# Patient Record
Sex: Female | Born: 1965 | Race: White | Hispanic: No | Marital: Single | Smoking: Never smoker
Health system: Southern US, Community
[De-identification: ages and names within clinical notes are randomized; demographics above are authoritative.]

## PROBLEM LIST (undated history)

## (undated) DIAGNOSIS — E119 Type 2 diabetes mellitus without complications: Secondary | ICD-10-CM

## (undated) HISTORY — PX: KNEE ARTHROSCOPY: SUR90

---

## 2012-01-24 ENCOUNTER — Emergency Department (HOSPITAL_BASED_OUTPATIENT_CLINIC_OR_DEPARTMENT_OTHER)
Admission: EM | Admit: 2012-01-24 | Discharge: 2012-01-24 | Disposition: A | Discharge status: Home / Self Care | Payer: BC Managed Care – PPO | Attending: Emergency Medicine | Admitting: Emergency Medicine

## 2012-01-24 ENCOUNTER — Emergency Department (HOSPITAL_BASED_OUTPATIENT_CLINIC_OR_DEPARTMENT_OTHER): Payer: BC Managed Care – PPO

## 2012-01-24 ENCOUNTER — Encounter (HOSPITAL_BASED_OUTPATIENT_CLINIC_OR_DEPARTMENT_OTHER): Payer: Self-pay | Admitting: *Deleted

## 2012-01-24 DIAGNOSIS — S6990XA Unspecified injury of unspecified wrist, hand and finger(s), initial encounter: Secondary | ICD-10-CM | POA: Insufficient documentation

## 2012-01-24 DIAGNOSIS — Y998 Other external cause status: Secondary | ICD-10-CM | POA: Insufficient documentation

## 2012-01-24 DIAGNOSIS — Y9301 Activity, walking, marching and hiking: Secondary | ICD-10-CM | POA: Insufficient documentation

## 2012-01-24 DIAGNOSIS — S6992XA Unspecified injury of left wrist, hand and finger(s), initial encounter: Secondary | ICD-10-CM

## 2012-01-24 DIAGNOSIS — W010XXA Fall on same level from slipping, tripping and stumbling without subsequent striking against object, initial encounter: Secondary | ICD-10-CM | POA: Insufficient documentation

## 2012-01-24 DIAGNOSIS — S59909A Unspecified injury of unspecified elbow, initial encounter: Secondary | ICD-10-CM | POA: Insufficient documentation

## 2012-01-24 NOTE — ED Notes (Addendum)
Given ice pack

## 2012-01-24 NOTE — ED Notes (Signed)
Pt states she slipped on the grass and tried to catch herself with her left wrist. No c/o pain to the left wrist and forearm. +radial pulse. Moves fingers. Feels touch. Cap refill < 3 sec.

## 2012-01-24 NOTE — Discharge Instructions (Signed)
Continue taking over-the-counter ibuprofen as needed for your symptoms.

## 2012-01-24 NOTE — ED Provider Notes (Signed)
History  This chart was scribed for Cyndra Numbers, MD by Erskine Emery. This patient was seen in room MH03/MH03 and the patient's care was started at 1727.  CSN: [id number]  Arrival date & time 01/24/12  1613   First MD Initiated Contact with Patient 01/24/12 1727      Chief Complaint  Patient presents with  . Fall    (Consider location/radiation/quality/duration/timing/severity/associated sxs/prior treatment) HPI  Erica Olson is a 46 y.o. female who presents to the Emergency Department complaining of constant pain in the wrist and forearm with a 5/5 severity since this afternoon with associated hematoma. Pt reports she was walking on the grass, slipped, and fell on her left wrist but denies hitting head. Pt denies any associated loss of consciousness or other injuries. Pt reports numbness in forearm, possibly from the ice, but denies any numbness in her hand. Pt reports she took Ibuprofen PTA which improved her symptoms. Pt reports she is right handed and she drove herself to the ED today. Pt reports no prior hx of similar symptoms. Pain is worse with movement or palpation.   History reviewed. No pertinent past medical history.  History reviewed. No pertinent past surgical history.  History reviewed. No pertinent family history.  History  Substance Use Topics  . Smoking status: Never Smoker   . Smokeless tobacco: Not on file  . Alcohol Use: No    OB History    Grav Para Term Preterm Abortions TAB SAB Ect Mult Living                  Review of Systems  Constitutional: Negative.   HENT: Negative.   Eyes: Negative.   Respiratory: Negative.   Cardiovascular: Negative.   Gastrointestinal: Negative.   Genitourinary: Negative.   Musculoskeletal:       Left wrist pain  Skin: Negative.   Neurological: Negative.  Negative for syncope.  Hematological: Negative.   Psychiatric/Behavioral: Negative.   All other systems reviewed and are negative.   A complete 10 system review of  systems was obtained and all systems are negative except as noted in the HPI and PMH.   Allergies  Review of patient's allergies indicates not on file.  Home Medications  No current outpatient prescriptions on file.  Triage Vitals: BP 157/83  Pulse 92  Temp 98 F (36.7 C) (Oral)  Resp 20  Ht 5\' 1"  (1.549 m)  Wt 270 lb (122.471 kg)  BMI 51.02 kg/m2  SpO2 98%  LMP 01/10/2012  Physical Exam GEN: Well-developed, well-nourished female in no distress HEENT: Atraumatic, normocephalic.  EYES: PERRLA BL, no scleral icterus. NECK: Trachea midline, no meningismus CV: regular rate and rhythm.  PULM: No respiratory distress.   Neuro: cranial nerves grossly 2-12 intact, no abnormalities of strength or sensation, A and O x 3 MSK: Left wrist no obvious deformity. Radial pulses 2+ and strong. Patient with slight area of numbness over the left forearm which is 2 inches in length and 1 inch in diameter and slightly ecchymotic. Patient's been holding ice to wrist and feels that this is the cause of the numbness. Grip is 55 the left. Otherwise patient has exam within normal limits  Skin: No rashes petechiae, purpura, or jaundice. Slight ecchymosis developing over the left forearm. Psych: no abnormality of mood  ED Course  Procedures (including critical care time)  DIAGNOSTIC STUDIES: Oxygen Saturation is 98% on room air, normal by my interpretation.    COORDINATION OF CARE: 17:56--I discussed treatment plan including a  splint with pt and pt agreed. I informed her that her x-ray shows no fractures.    Labs Reviewed - No data to display Dg Forearm Left  01/24/2012  *RADIOLOGY REPORT*  Clinical Data: Fall  LEFT FOREARM - 2 VIEW  Comparison: None.  Findings: No acute fracture and no dislocation.  Unremarkable soft tissues.  IMPRESSION: No acute bony pathology.  Original Report Authenticated By: Donavan Burnet, M.D.   Dg Wrist Complete Left  01/24/2012  *RADIOLOGY REPORT*  Clinical Data: Fall  LEFT  WRIST - COMPLETE 3+ VIEW  Comparison: None.  Findings: No acute fracture and no dislocation.  Unremarkable soft tissues. Mild positive ulnar variance.  IMPRESSION: No acute bony pathology.  Original Report Authenticated By: Donavan Burnet, M.D.     1. Left wrist injury       MDM  Patient was evaluated by myself for a mechanical fall. She had left forearm and left wrist film ordered per protocol there were both negative. Patient had no pain in the hand itself. She was neurovascularly intact with no deformity or other significant abnormalities. She absolutely denied head injury or syncope with this. Patient had left Velcro wrist splint applied for support was given ice pack. She can continue ibuprofen as needed for pain. Should she require additional followup she was given the name of Dr. Pearletha Forge from sports medicine. I do not anticipate that she should require additional medical attention for this injury.   I personally performed the services described in this documentation, which was scribed in my presence. The recorded information has been reviewed and considered.        Cyndra Numbers, MD 01/24/12 [phone]

## 2013-08-16 IMAGING — CR DG WRIST COMPLETE 3+V*L*
4 series · 4 of 4 positions shown · non-contrast
Comparison: None.

CLINICAL DATA: Fall

LEFT WRIST - COMPLETE 3+ VIEW

[x wrist pa left]
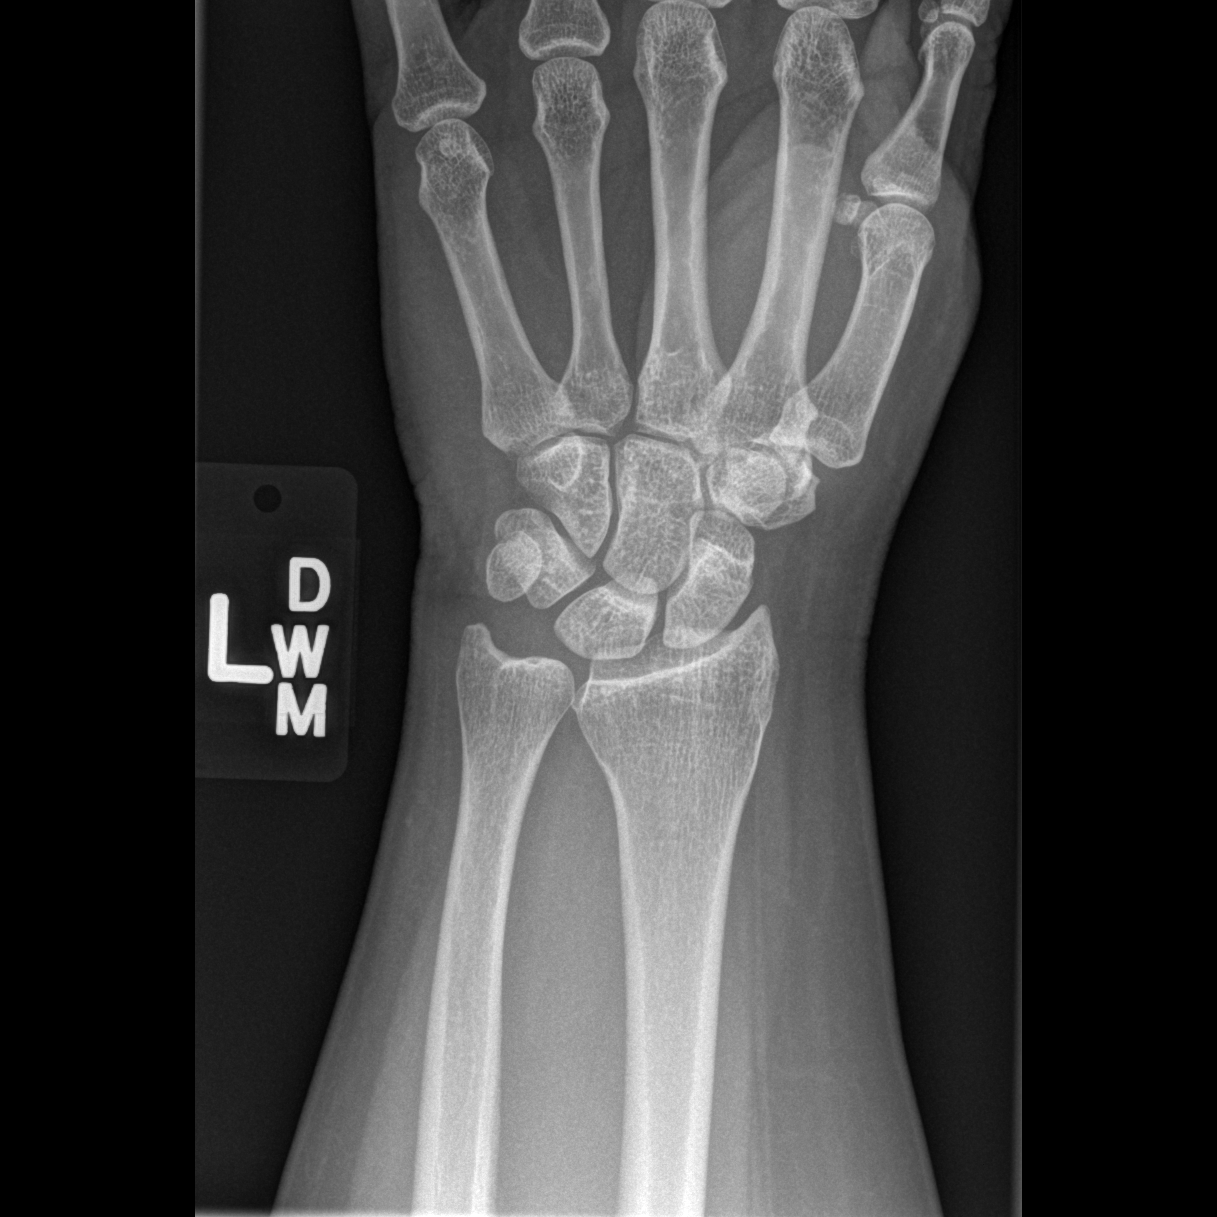

[x wrist obl left]
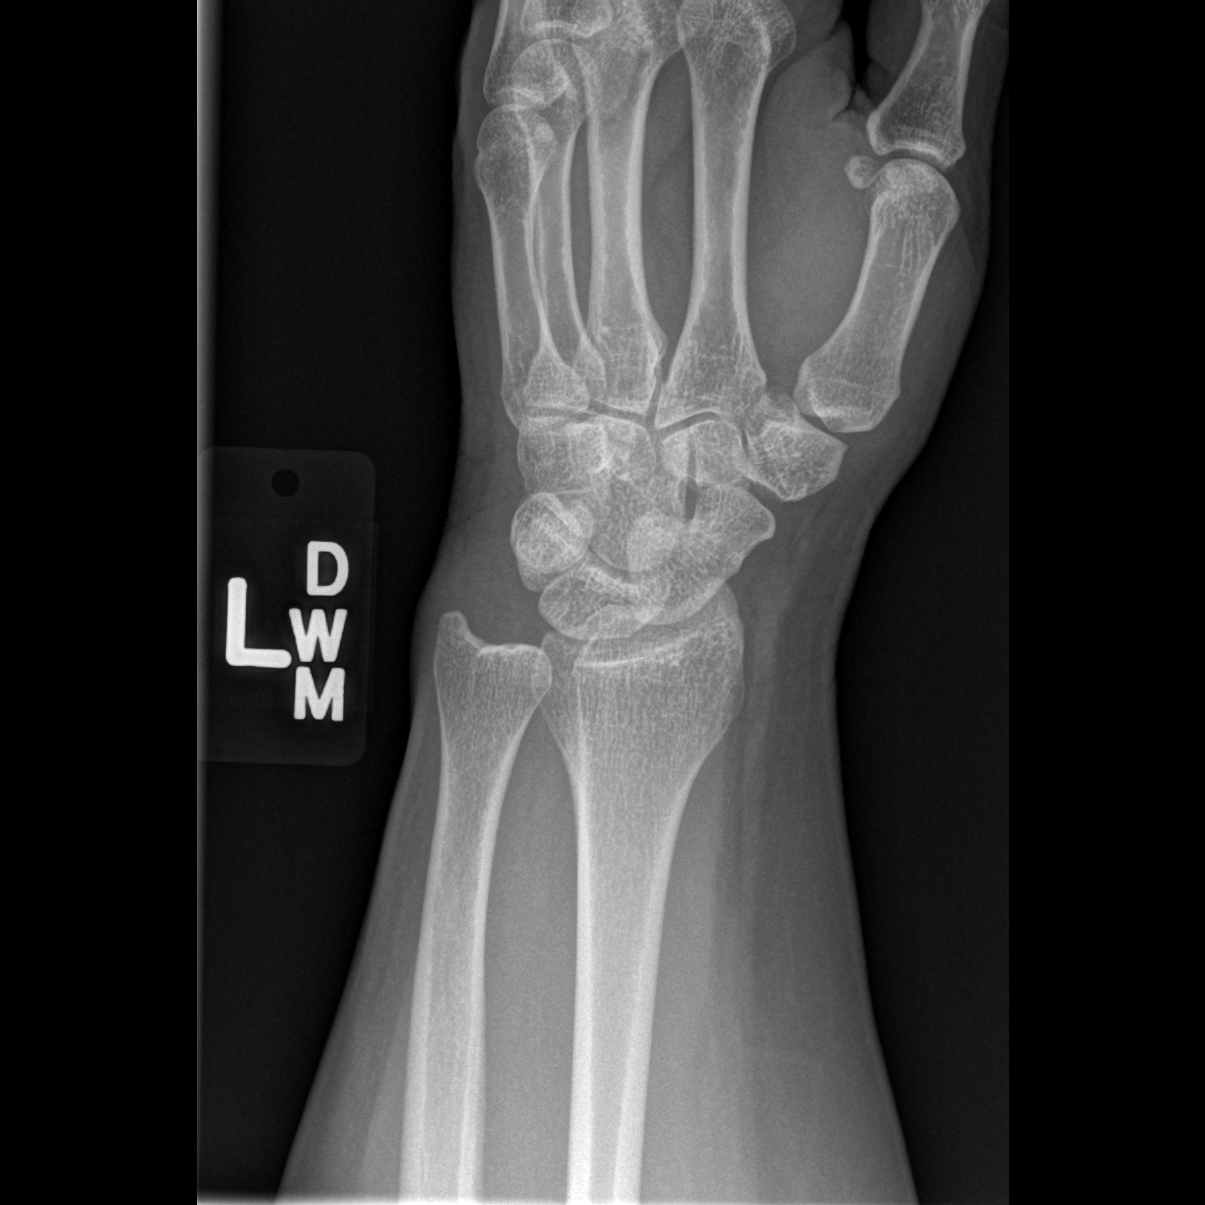

[x wrist lat left]
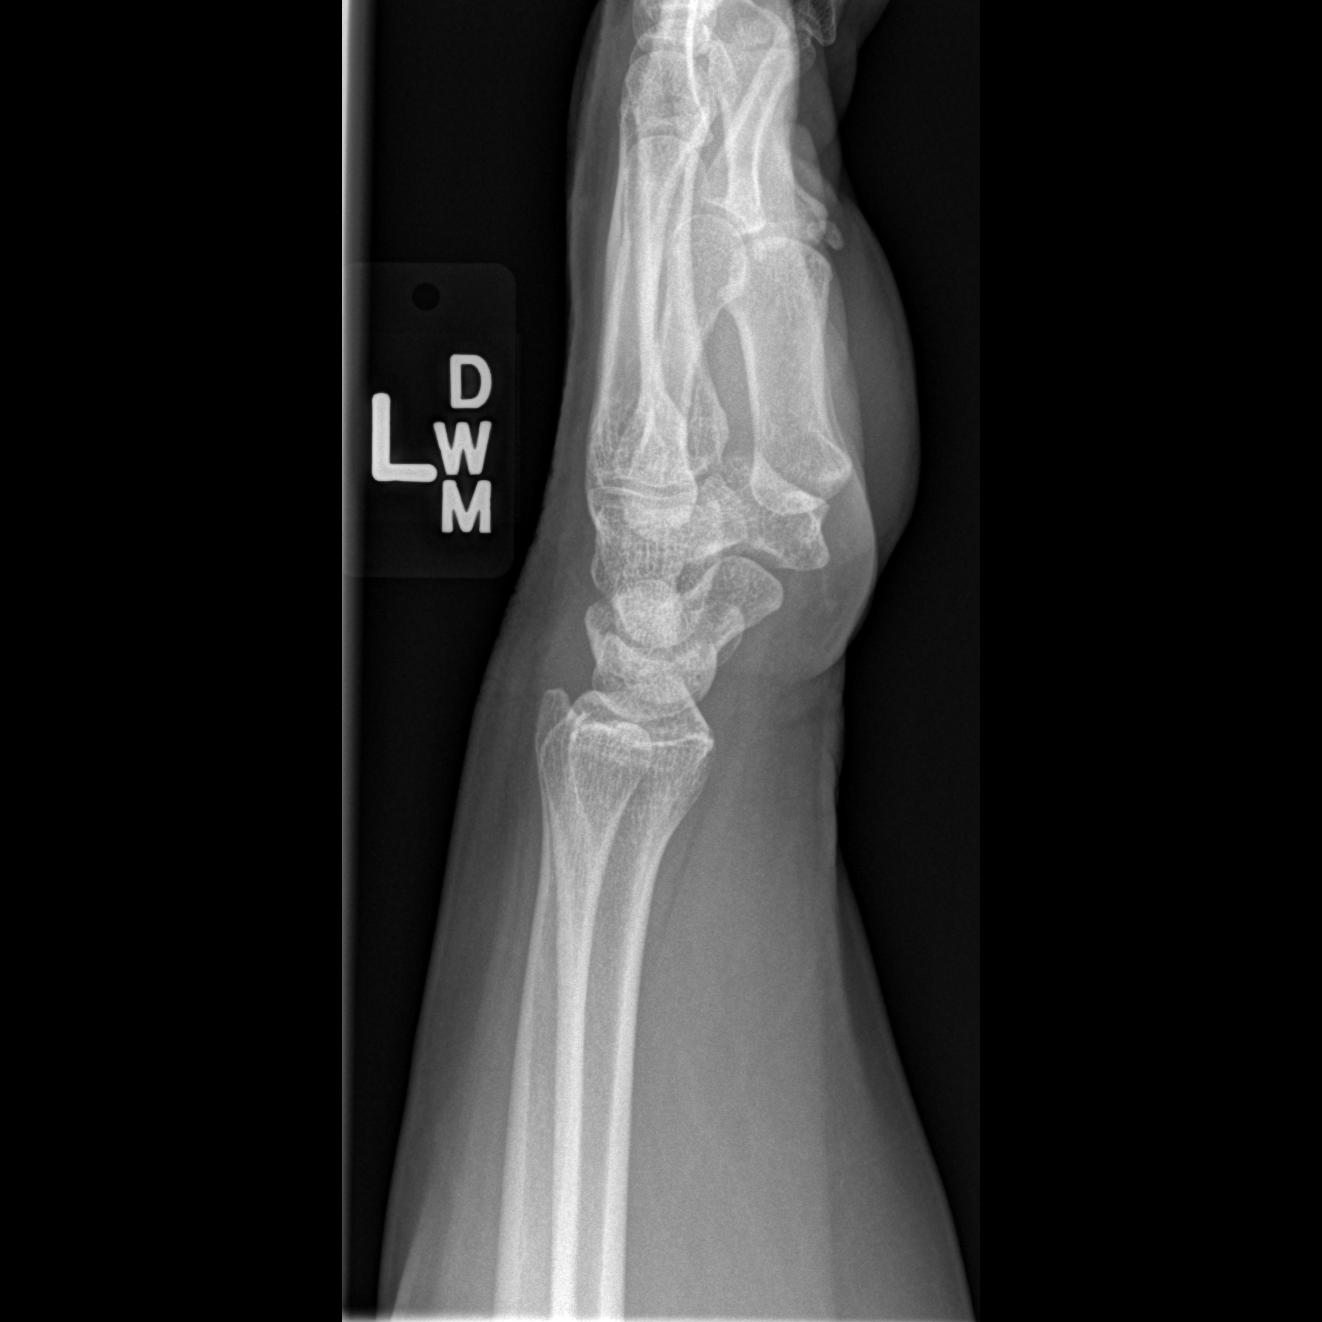

[x navicular]
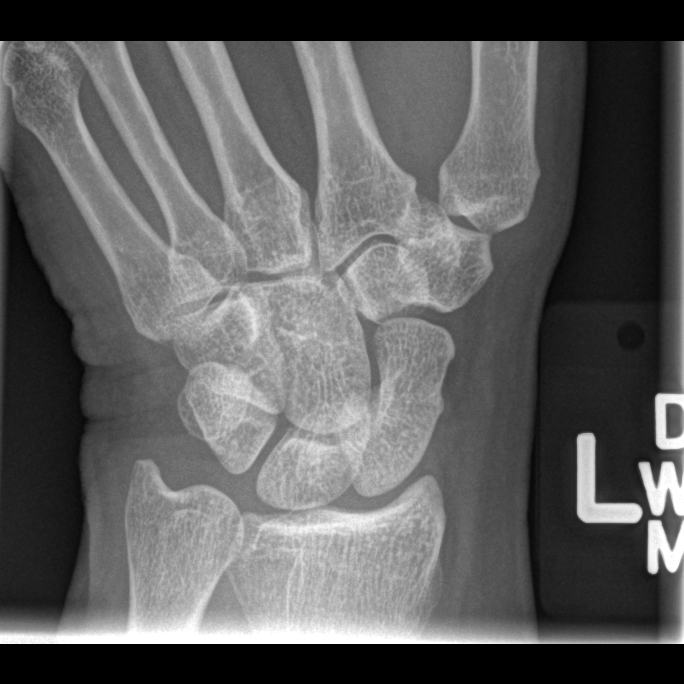

[4 of 4 positions shown; findings below may reference images not displayed]

FINDINGS: No acute fracture and no dislocation.  Unremarkable soft
tissues. Mild positive ulnar variance.
IMPRESSION: No acute bony pathology.

## 2013-08-16 IMAGING — CR DG FOREARM 2V*L*
2 series · 2 of 2 positions shown · non-contrast
Comparison: None.

CLINICAL DATA: Fall

LEFT FOREARM - 2 VIEW

[x forearm ap left]
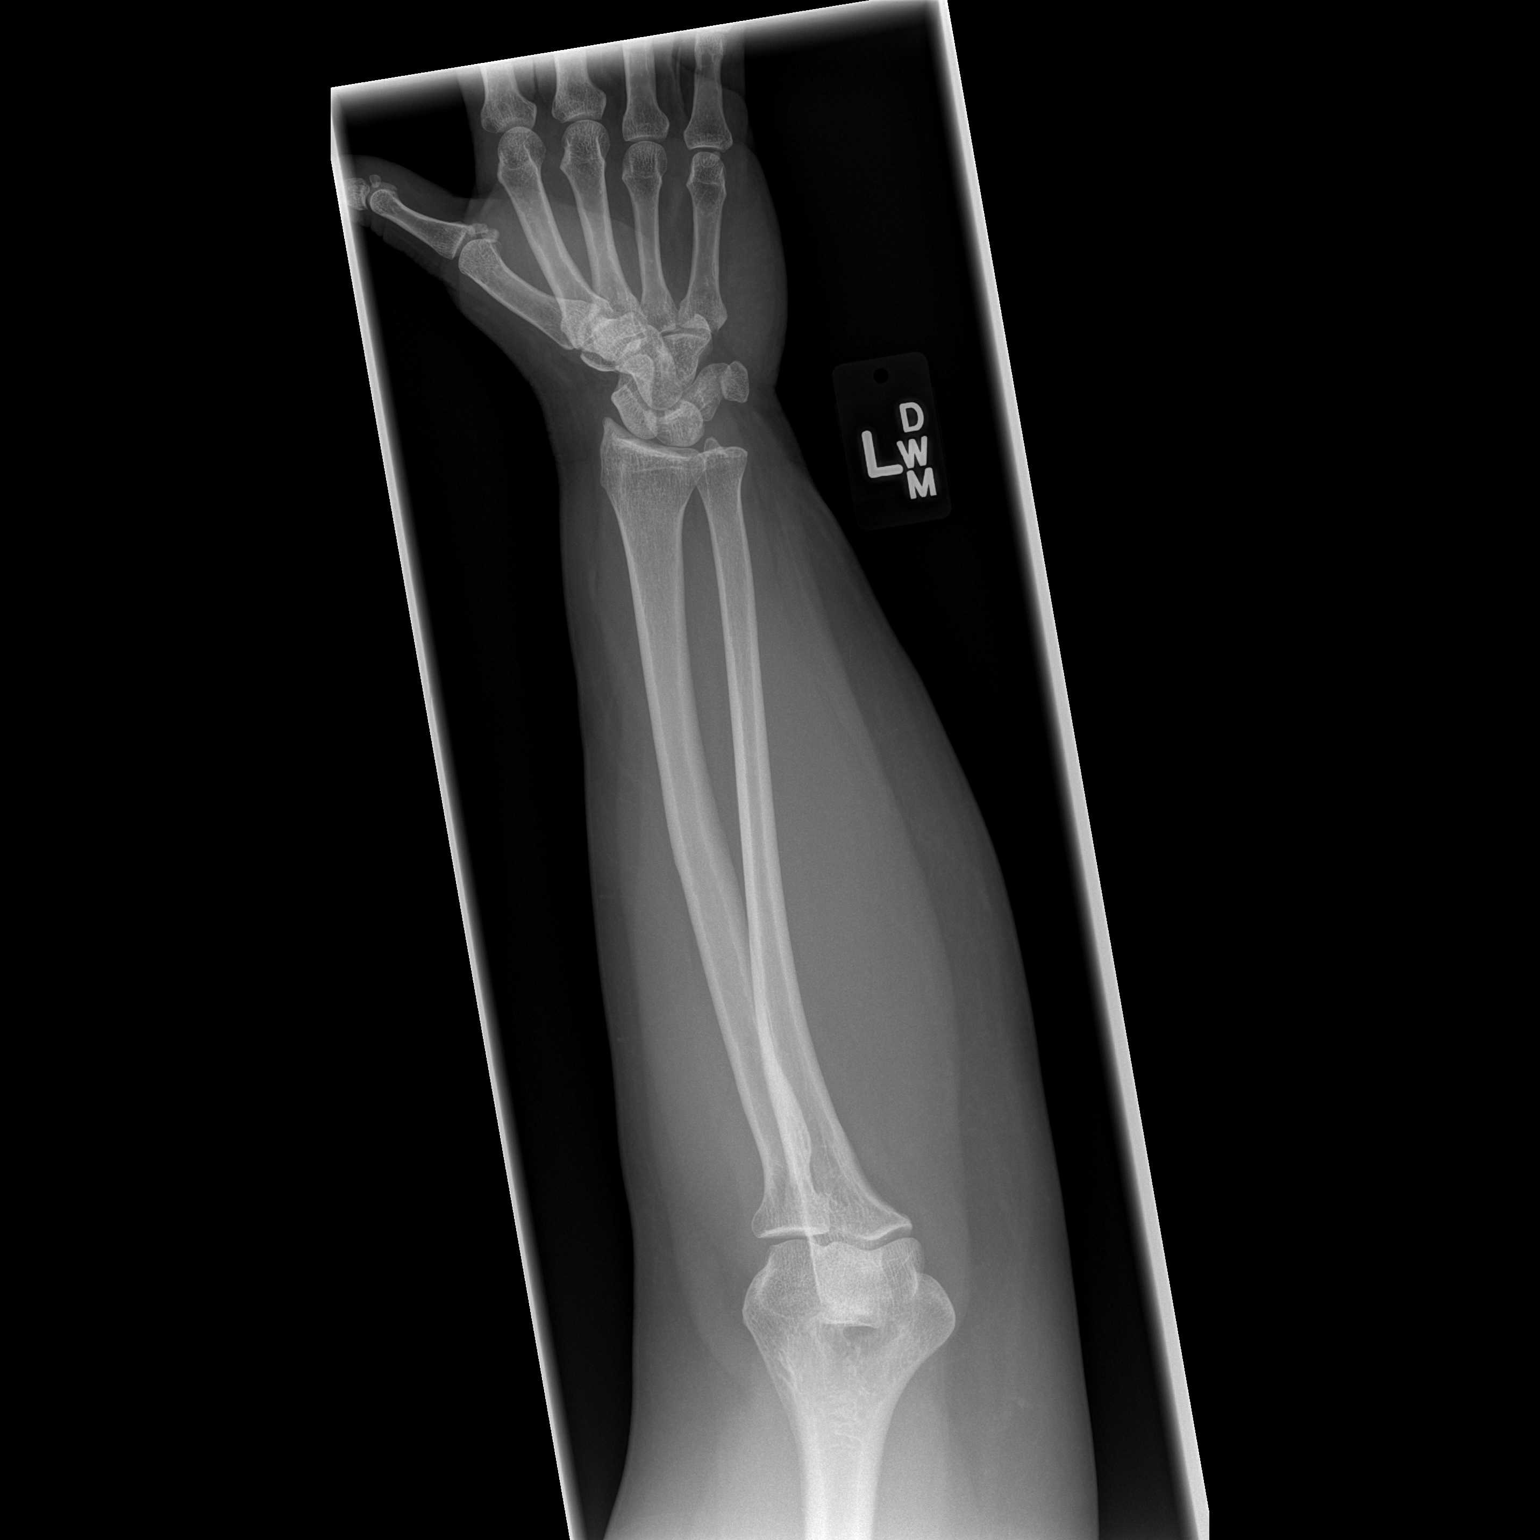

[x forearm lat left]
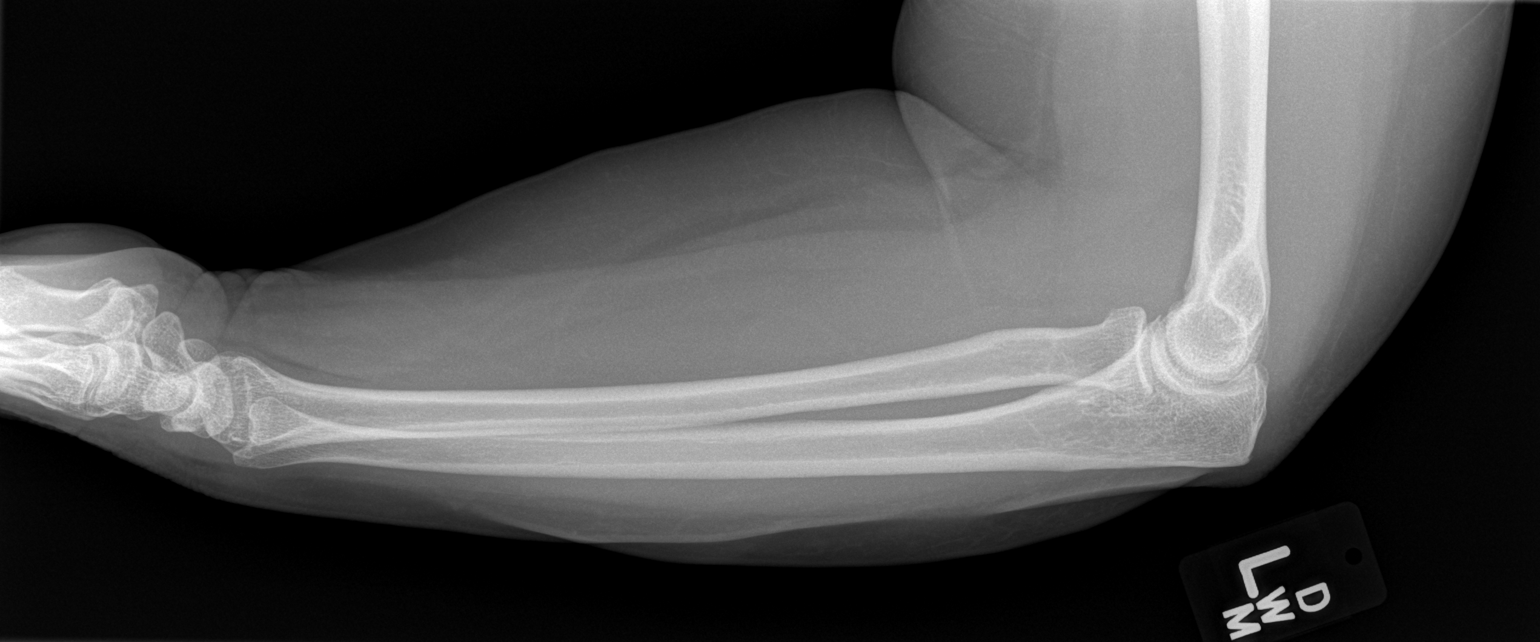

[2 of 2 positions shown; findings below may reference images not displayed]

FINDINGS: No acute fracture and no dislocation.  Unremarkable soft
tissues.
IMPRESSION: No acute bony pathology.

## 2021-09-07 ENCOUNTER — Encounter (HOSPITAL_COMMUNITY): Payer: Self-pay

## 2021-09-07 ENCOUNTER — Other Ambulatory Visit: Payer: Self-pay

## 2021-09-07 ENCOUNTER — Emergency Department (HOSPITAL_COMMUNITY)
Admission: EM | Admit: 2021-09-07 | Discharge: 2021-09-08 | Disposition: A | Discharge status: Home / Self Care | Payer: No Typology Code available for payment source | Attending: Emergency Medicine | Admitting: Emergency Medicine

## 2021-09-07 DIAGNOSIS — S199XXA Unspecified injury of neck, initial encounter: Secondary | ICD-10-CM | POA: Diagnosis present

## 2021-09-07 DIAGNOSIS — X58XXXA Exposure to other specified factors, initial encounter: Secondary | ICD-10-CM | POA: Diagnosis not present

## 2021-09-07 DIAGNOSIS — S161XXA Strain of muscle, fascia and tendon at neck level, initial encounter: Secondary | ICD-10-CM | POA: Diagnosis not present
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
## 2021-09-08 MED ORDER — LIDOCAINE 5 % EX PTCH: 1.0000 | MEDICATED_PATCH | CUTANEOUS | 0 refills | Status: AC

## 2021-09-08 MED ORDER — METHOCARBAMOL 500 MG PO TABS: 500.0000 mg | ORAL_TABLET | Freq: Three times a day (TID) | ORAL | 0 refills | Status: DC | PRN

## 2021-09-08 NOTE — ED Notes (Signed)
Went over dc papers. All questions answered. Ambulatory to lobby .  

## 2021-09-08 NOTE — Discharge Instructions (Signed)
You were evaluated in the Emergency Department and after careful evaluation, we did not find any emergent condition requiring admission or further testing in the hospital.  Your exam/testing today is overall reassuring.  Symptoms seem to be due to a muscle strain or spasm.  Continue taking Tylenol and Motrin as needed for pain.  Can also use the Lidoderm numbing patches or the Robaxin muscle relaxers.  Please return to the Emergency Department if you experience any worsening of your condition.   Thank you for allowing [place] to be a part of your care.
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
## 2021-10-05 ENCOUNTER — Emergency Department (HOSPITAL_COMMUNITY)
Admission: EM | Admit: 2021-10-05 | Discharge: 2021-10-05 | Disposition: A | Discharge status: Home / Self Care | Payer: No Typology Code available for payment source | Attending: Emergency Medicine | Admitting: Emergency Medicine

## 2021-10-05 ENCOUNTER — Encounter (HOSPITAL_COMMUNITY): Payer: Self-pay | Admitting: Emergency Medicine

## 2021-10-05 DIAGNOSIS — S169XXD Unspecified injury of muscle, fascia and tendon at neck level, subsequent encounter: Secondary | ICD-10-CM | POA: Diagnosis present

## 2021-10-05 DIAGNOSIS — Y990 Civilian activity done for income or pay: Secondary | ICD-10-CM | POA: Insufficient documentation

## 2021-10-05 DIAGNOSIS — J069 Acute upper respiratory infection, unspecified: Secondary | ICD-10-CM | POA: Insufficient documentation

## 2021-10-05 DIAGNOSIS — H9201 Otalgia, right ear: Secondary | ICD-10-CM | POA: Diagnosis not present

## 2021-10-05 DIAGNOSIS — S161XXD Strain of muscle, fascia and tendon at neck level, subsequent encounter: Secondary | ICD-10-CM | POA: Diagnosis not present

## 2021-10-05 HISTORY — DX: Type 2 diabetes mellitus without complications: E11.9

## 2021-10-05 MED ORDER — METHOCARBAMOL 500 MG PO TABS: 500.0000 mg | ORAL_TABLET | Freq: Two times a day (BID) | ORAL | 0 refills | Status: AC

## 2021-10-05 NOTE — Discharge Instructions (Addendum)
You were seen in emergency department today for neck pain and ear pain. ? ?As we discussed your ear looks like it has some fluid behind it, but it does not look infected.  I have refilled your muscle relaxer for your neck pain.  I also recommend lotions like Biofreeze or IcyHot, that may provide you some relief while you are at work better than the patches.   ? ?For your cold, I recommend Tylenol and ibuprofen as needed for pain or fever.  I also like decongestants like Mucinex, if taken with lots of water.  I attached some instructions for how to perform a sinus rinse like we talked about. ? ?Continue to monitor how you're doing and return to the ER for new or worsening symptoms.  ?
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
## 2021-10-05 NOTE — ED Triage Notes (Signed)
Pain to RT side of neck for over a month.  Seen here for the same on 09/07/2021.  Reports RT ear pain that started on Thursday.   ?

## 2022-04-28 ENCOUNTER — Other Ambulatory Visit: Payer: Self-pay

## 2022-04-28 ENCOUNTER — Encounter (HOSPITAL_COMMUNITY): Payer: Self-pay | Admitting: Emergency Medicine

## 2022-04-28 ENCOUNTER — Emergency Department (HOSPITAL_COMMUNITY): Payer: No Typology Code available for payment source

## 2022-04-28 ENCOUNTER — Emergency Department (HOSPITAL_COMMUNITY)
Admission: EM | Admit: 2022-04-28 | Discharge: 2022-04-29 | Disposition: A | Discharge status: Home / Self Care | Payer: No Typology Code available for payment source | Attending: Emergency Medicine | Admitting: Emergency Medicine

## 2022-04-28 DIAGNOSIS — Z7984 Long term (current) use of oral hypoglycemic drugs: Secondary | ICD-10-CM | POA: Diagnosis not present

## 2022-04-28 DIAGNOSIS — R42 Dizziness and giddiness: Secondary | ICD-10-CM | POA: Diagnosis present

## 2022-04-28 DIAGNOSIS — R079 Chest pain, unspecified: Secondary | ICD-10-CM | POA: Insufficient documentation

## 2022-04-28 LAB — CBC WITH DIFFERENTIAL/PLATELET
Abs Immature Granulocytes: 0.01 10*3/uL (ref 0.00–0.07)
Basophils Absolute: 0 10*3/uL (ref 0.0–0.1)
Basophils Relative: 1 %
Eosinophils Absolute: 0.1 10*3/uL (ref 0.0–0.5)
Eosinophils Relative: 3 %
HCT: 32.3 % — ABNORMAL LOW (ref 36.0–46.0)
Hemoglobin: 10.4 g/dL — ABNORMAL LOW (ref 12.0–15.0)
Immature Granulocytes: 0 %
Lymphocytes Relative: 38 %
Lymphs Abs: 1.3 10*3/uL (ref 0.7–4.0)
MCH: 28.6 pg (ref 26.0–34.0)
MCHC: 32.2 g/dL (ref 30.0–36.0)
MCV: 88.7 fL (ref 80.0–100.0)
Monocytes Absolute: 0.2 10*3/uL (ref 0.1–1.0)
Monocytes Relative: 5 %
Neutro Abs: 1.8 10*3/uL (ref 1.7–7.7)
Neutrophils Relative %: 53 %
Platelets: 117 10*3/uL — ABNORMAL LOW (ref 150–400)
RBC: 3.64 MIL/uL — ABNORMAL LOW (ref 3.87–5.11)
RDW: 14.6 % (ref 11.5–15.5)
WBC: 3.4 10*3/uL — ABNORMAL LOW (ref 4.0–10.5)
nRBC: 0 % (ref 0.0–0.2)

## 2022-04-28 LAB — COMPREHENSIVE METABOLIC PANEL
ALT: 18 U/L (ref 0–44)
AST: 32 U/L (ref 15–41)
Albumin: 3.4 g/dL — ABNORMAL LOW (ref 3.5–5.0)
Alkaline Phosphatase: 76 U/L (ref 38–126)
Anion gap: 6 (ref 5–15)
BUN: 12 mg/dL (ref 6–20)
CO2: 22 mmol/L (ref 22–32)
Calcium: 8.8 mg/dL — ABNORMAL LOW (ref 8.9–10.3)
Chloride: 110 mmol/L (ref 98–111)
Creatinine, Ser: 0.56 mg/dL (ref 0.44–1.00)
GFR, Estimated: >60 mL/min (ref >60)
Glucose, Bld: 239 mg/dL — ABNORMAL HIGH (ref 70–99)
Potassium: 3.6 mmol/L (ref 3.5–5.1)
Sodium: 138 mmol/L (ref 135–145)
Total Bilirubin: 0.8 mg/dL (ref 0.3–1.2)
Total Protein: 7 g/dL (ref 6.5–8.1)

## 2022-04-28 LAB — TROPONIN I (HIGH SENSITIVITY)
Troponin I (High Sensitivity): 4 ng/L (ref <18)
Troponin I (High Sensitivity): 4 ng/L (ref <18)

## 2022-04-28 MED ORDER — MECLIZINE HCL 25 MG PO TABS
25.0000 mg | ORAL_TABLET | Freq: Once | ORAL | Status: AC
  Administered 2022-04-28: 25 mg via ORAL
  Filled 2022-04-28: qty 1

## 2022-04-28 MED ORDER — SODIUM CHLORIDE 0.9 % IV BOLUS
1000.0000 mL | Freq: Once | INTRAVENOUS | Status: AC
  Administered 2022-04-28: 1000 mL via INTRAVENOUS

## 2022-04-28 MED ORDER — LORAZEPAM 2 MG/ML IJ SOLN: 1.0000 mg | Freq: Once | INTRAMUSCULAR | Status: DC | PRN

## 2022-04-28 NOTE — ED Triage Notes (Signed)
Pt BIB [facility] from [facility] for chest wall pain x "several weeks"; v/s WNL, CBG 275

## 2022-04-28 NOTE — ED Provider Triage Note (Signed)
Emergency Medicine Provider Triage Evaluation Note  Erica Olson , a 56 y.o. female  was evaluated in triage.  Pt complains of chest pain, shortness of breath.  Patient states that chest pain has been intermittent for the past week.  She notes today, chest pain being constant since onset.  She notes some associated feelings of shortness of breath.  She notes the tenderness is felt when pressing on the left side of her chest.  Chest pain described as heaviness.  She secondarily endorses some feelings of dizziness and present for the past 2 weeks that is positional in nature when getting up from a seated position or lying down at night.  Symptoms resolved spontaneously.  Resolved spontaneously; she is not currently experiencing dizziness.  Review of Systems  Positive: See above Negative:   Physical Exam  LMP 01/10/2012  Gen:   Awake, no distress   Resp:  Normal effort  MSK:   Moves extremities without difficulty  Other:  Chest pain reproducible full with palpation on left anterior chest wall.  Medical Decision Making  Medically screening exam initiated at 8:31 PM.  Appropriate orders placed.  Erica Olson was informed that the remainder of the evaluation will be completed by another provider, this initial triage assessment does not replace that evaluation, and the importance of remaining in the ED until their evaluation is complete.     Wilnette Kales, [place] 04/28/22 2033

## 2022-04-28 NOTE — ED Provider Notes (Signed)
[facility] DEPT Provider Note   CSN: [id number] Arrival date & time: 04/28/22  2017     History  Chief Complaint  Patient presents with   Chest Pain    Annalyssa Thune is a 56 y.o. female here presenting with chest pain and dizziness.  Patient has been having dizziness for the last week or so.  Patient states that it is worse when she turns her head or stands up.  She states that the room is also spinning.  She states that she felt very dizzy at work and left work around 5 PM.  She went to [employer] and felt very dizzy and had some chest pain.  Patient states that her chest pain is left-sided and she felt heavy in the left chest.  Patient has no history of CAD or stents.  Patient denies any history of stroke in the past  The history is provided by the patient.       Home Medications Prior to Admission medications   Medication Sig Start Date End Date Taking? Authorizing Provider  lidocaine (LIDODERM) 5 % Place 1 patch onto the skin daily. Remove & Discard patch within 12 hours or as directed by MD 09/08/21   Maudie Flakes, MD  methocarbamol (ROBAXIN) 500 MG tablet Take 1 tablet (500 mg total) by mouth 2 (two) times daily. 10/05/21   Roemhildt, Lorin T, PA-C      Allergies    Patient has no known allergies.    Review of Systems   Review of Systems  Cardiovascular:  Positive for chest pain.  All other systems reviewed and are negative.   Physical Exam Updated Vital Signs Ht 5\' 1"  (1.549 m)   Wt 115.7 kg   LMP 01/10/2012   BMI 48.18 kg/m  Physical Exam Vitals and nursing note reviewed.  Constitutional:      Comments: Chronically ill  HENT:     Head: Normocephalic.  Eyes:     Extraocular Movements: Extraocular movements intact.     Pupils: Pupils are equal, round, and reactive to light.  Cardiovascular:     Rate and Rhythm: Normal rate and regular rhythm.     Heart sounds: Normal heart sounds.  Pulmonary:     Effort: Pulmonary effort is  normal.     Breath sounds: Normal breath sounds.  Abdominal:     General: Bowel sounds are normal.     Palpations: Abdomen is soft.  Musculoskeletal:        General: Normal range of motion.     Cervical back: Normal range of motion and neck supple.  Skin:    Capillary Refill: Capillary refill takes less than 2 seconds.  Neurological:     Comments: Patient has decreased sensation in the right arm and leg.  Patient has strength 4 out of 5 on the right arm and 4 out of 5 in the right leg.  Patient has 5 out of 5 strength on the left side.  Patient has some mild dysmetria on the right.  Patient has nystagmus on the left.  Patient unable to stand up due to dizziness.    ED Results / Procedures / Treatments   Labs (all labs ordered are listed, but only abnormal results are displayed) Labs Reviewed  CBC WITH DIFFERENTIAL/PLATELET - Abnormal; Notable for the following components:      Result Value   WBC 3.4 (*)    RBC 3.64 (*)    Hemoglobin 10.4 (*)    HCT 32.3 (*)  Platelets 117 (*)    All other components within normal limits  COMPREHENSIVE METABOLIC PANEL  TROPONIN I (HIGH SENSITIVITY)    EKG EKG Interpretation  Date/Time:  Tuesday April 28 2022 20:36:53 EDT Ventricular Rate:  91 PR Interval:  142 QRS Duration: 77 QT Interval:  385 QTC Calculation: 474 R Axis:   25 Text Interpretation: Sinus rhythm Low voltage, precordial leads No previous ECGs available Confirmed by Wandra Arthurs [id number]) on 04/28/2022 9:16:03 PM  Radiology DG Chest Port 1 View  Result Date: 04/28/2022 CLINICAL DATA:  Increasing chest pain and pressure for 1 week. Dizziness and leg swelling today. EXAM: PORTABLE CHEST 1 VIEW COMPARISON:  11/25/2021 FINDINGS: Heart size and pulmonary vascularity are normal. Lungs are clear. Previous effusion and consolidation on the left have resolved. No pleural effusions or pneumothorax today. Mediastinal contours appear intact. IMPRESSION: No active disease.  Electronically Signed   By: Lucienne Capers M.D.   On: 04/28/2022 20:52    Procedures Procedures    Angiocath insertion Performed by: Wandra Arthurs  Consent: Verbal consent obtained. Risks and benefits: risks, benefits and alternatives were discussed Time out: Immediately prior to procedure a "time out" was called to verify the correct patient, procedure, equipment, support staff and site/side marked as required.  Preparation: Patient was prepped and draped in the usual sterile fashion.  Vein Location: R antecube  Ultrasound Guided  Gauge: 20 long   Normal blood return and flush without difficulty Patient tolerance: Patient tolerated the procedure well with no immediate complications.    Medications Ordered in ED Medications  sodium chloride 0.9 % bolus 1,000 mL (has no administration in time range)  LORazepam (ATIVAN) injection 1 mg (has no administration in time range)  meclizine (ANTIVERT) tablet 25 mg (has no administration in time range)    ED Course/ Medical Decision Making/ A&P                           Medical Decision Making Nohely Guia is a 56 y.o. female here with chest pain and dizziness.  Vertiginous symptoms for the last 2 days and some right-sided arm and leg weakness and numbness.  Consider stroke versus peripheral vertigo versus complex migraines.  Patient also have some chest pain but there is no risk for ACS.  Plan to get CBC and CMP and troponin x2.  Also plan to get MRI brain  11:27 PM Initial troponin is negative and second troponin pending.  MRI brain is pending.  Signed out to Dr. Dina Rich in the ED.    Amount and/or Complexity of Data Reviewed Labs: ordered. Decision-making details documented in ED Course. Radiology: ordered and independent interpretation performed. Decision-making details documented in ED Course. ECG/medicine tests: ordered and independent interpretation performed. Decision-making details documented in ED  Course.  Risk Prescription drug management.    Final Clinical Impression(s) / ED Diagnoses Final diagnoses:  None    Rx / DC Orders ED Discharge Orders     None         Drenda Freeze, MD 04/28/22 2328

## 2022-04-28 NOTE — Discharge Instructions (Addendum)
You were seen today for several complaints including dizziness and chest discomfort.  Your MRI does not show any evidence of stroke.  Your dizziness may be related to benign positional vertigo.  Your work-up for chest pain has been unremarkable.  Follow-up closely with your primary physician.

## 2022-04-29 ENCOUNTER — Encounter (HOSPITAL_COMMUNITY): Payer: Self-pay | Admitting: Emergency Medicine

## 2022-04-29 ENCOUNTER — Other Ambulatory Visit: Payer: Self-pay

## 2022-04-29 ENCOUNTER — Emergency Department (HOSPITAL_COMMUNITY)
Admission: EM | Admit: 2022-04-29 | Discharge: 2022-04-29 | Disposition: A | Discharge status: Home / Self Care | Payer: No Typology Code available for payment source | Source: Home / Self Care | Attending: Emergency Medicine | Admitting: Emergency Medicine

## 2022-04-29 DIAGNOSIS — Z7984 Long term (current) use of oral hypoglycemic drugs: Secondary | ICD-10-CM | POA: Insufficient documentation

## 2022-04-29 DIAGNOSIS — R42 Dizziness and giddiness: Secondary | ICD-10-CM | POA: Insufficient documentation

## 2022-04-29 MED ORDER — MECLIZINE HCL 25 MG PO TABS: 25.0000 mg | ORAL_TABLET | Freq: Three times a day (TID) | ORAL | 0 refills | Status: AC | PRN

## 2022-04-29 MED ORDER — DIAZEPAM 2 MG PO TABS: 2.0000 mg | ORAL_TABLET | Freq: Four times a day (QID) | ORAL | 0 refills | Status: AC | PRN

## 2022-04-29 NOTE — ED Provider Notes (Signed)
[facility] DEPT Provider Note   CSN: [id number] Arrival date & time: 04/29/22  4259     History  Chief Complaint  Patient presents with   Dizziness    Erica Olson is a 56 y.o. female.  56 year old female presents with persistent dizziness.  Patient seen recently diagnosed with vertigo.  She had MRI done which did not show any acute findings.  Patient complains of right ear discomfort without hearing loss or tinnitus.  States she feels a fullness.  No fever or chills.  Her dizziness is described as positional.  Has been prescribed meclizine for this.       Home Medications Prior to Admission medications   Medication Sig Start Date End Date Taking? Authorizing Provider  IBUPROFEN PO Take 1 tablet by mouth every 6 (six) hours as needed (pain).    [provider]  lidocaine (LIDODERM) 5 % Place 1 patch onto the skin daily. Remove & Discard patch within 12 hours or as directed by MD Patient not taking: Reported on 04/28/2022 09/08/21   Maudie Flakes, MD  meclizine (ANTIVERT) 25 MG tablet Take 1 tablet (25 mg total) by mouth 3 (three) times daily as needed for dizziness. 04/29/22   Horton, Barbette Hair, MD  metFORMIN (GLUCOPHAGE) 500 MG tablet Take 500 mg by mouth 2 (two) times daily with a meal.    [provider]  methocarbamol (ROBAXIN) 500 MG tablet Take 1 tablet (500 mg total) by mouth 2 (two) times daily. Patient not taking: Reported on 04/28/2022 10/05/21   Roemhildt, Lorin T, PA-C      Allergies    Patient has no known allergies.    Review of Systems   Review of Systems  All other systems reviewed and are negative.   Physical Exam Updated Vital Signs BP 139/75 (BP Location: Left Arm)   Pulse 83   Temp 97.9 F (36.6 C) (Oral)   Resp 17   Ht 1.549 m (5\' 1" )   Wt 115.7 kg   LMP 01/10/2012   SpO2 98%   BMI 48.18 kg/m  Physical Exam Vitals and nursing note reviewed.  Constitutional:      General: She is not in  acute distress.    Appearance: Normal appearance. She is well-developed. She is not toxic-appearing.  HENT:     Head: Normocephalic and atraumatic.     Right Ear: Hearing normal.     Left Ear: Hearing normal.  Eyes:     General: Lids are normal.     Conjunctiva/sclera: Conjunctivae normal.     Pupils: Pupils are equal, round, and reactive to light.  Neck:     Thyroid: No thyroid mass.     Trachea: No tracheal deviation.  Cardiovascular:     Rate and Rhythm: Normal rate and regular rhythm.     Heart sounds: Normal heart sounds. No murmur heard.    No gallop.  Pulmonary:     Effort: Pulmonary effort is normal. No respiratory distress.     Breath sounds: Normal breath sounds. No stridor. No decreased breath sounds, wheezing, rhonchi or rales.  Abdominal:     General: There is no distension.     Palpations: Abdomen is soft.     Tenderness: There is no abdominal tenderness. There is no rebound.  Musculoskeletal:        General: No tenderness. Normal range of motion.     Cervical back: Normal range of motion and neck supple.  Skin:    General:  Skin is warm and dry.     Findings: No abrasion or rash.  Neurological:     General: No focal deficit present.     Mental Status: She is alert and oriented to person, place, and time. Mental status is at baseline.     GCS: GCS eye subscore is 4. GCS verbal subscore is 5. GCS motor subscore is 6.     Cranial Nerves: No cranial nerve deficit.     Sensory: No sensory deficit.     Motor: Motor function is intact.  Psychiatric:        Attention and Perception: Attention normal.        Speech: Speech normal.        Behavior: Behavior normal.     ED Results / Procedures / Treatments   Labs (all labs ordered are listed, but only abnormal results are displayed) Labs Reviewed - No data to display  EKG None  Radiology MR BRAIN WO CONTRAST  Result Date: 04/28/2022 CLINICAL DATA:  Dizziness EXAM: MRI HEAD WITHOUT CONTRAST TECHNIQUE:  Multiplanar, multiecho pulse sequences of the brain and surrounding structures were obtained without intravenous contrast. COMPARISON:  None Available. FINDINGS: Brain: No acute infarct, mass effect or extra-axial collection. No acute or chronic hemorrhage. There is multifocal hyperintense T2-weighted signal within the white matter. Parenchymal volume and CSF spaces are normal. The midline structures are normal. Vascular: Major flow voids are preserved. Skull and upper cervical spine: Normal calvarium and skull base. Visualized upper cervical spine and soft tissues are normal. Sinuses/Orbits:Right mastoid effusion. Paranasal sinuses are clear. Normal orbits. IMPRESSION: 1. No acute intracranial abnormality. 2. Multifocal hyperintense T2-weighted signal within the white matter, nonspecific but most commonly chronic small vessel ischemia. 3. Right mastoid effusion. Electronically Signed   By: Deatra Robinson M.D.   On: 04/28/2022 23:51   DG Chest Port 1 View  Result Date: 04/28/2022 CLINICAL DATA:  Increasing chest pain and pressure for 1 week. Dizziness and leg swelling today. EXAM: PORTABLE CHEST 1 VIEW COMPARISON:  11/25/2021 FINDINGS: Heart size and pulmonary vascularity are normal. Lungs are clear. Previous effusion and consolidation on the left have resolved. No pleural effusions or pneumothorax today. Mediastinal contours appear intact. IMPRESSION: No active disease. Electronically Signed   By: Burman Nieves M.D.   On: 04/28/2022 20:52    Procedures Procedures    Medications Ordered in ED Medications - No data to display  ED Course/ Medical Decision Making/ A&P                           Medical Decision Making  Patient presents with ongoing vertiginous symptoms.  Patient's neurological exam remained stable.  She has no evidence of otitis media at this time.  We will add to her regimen.  We will give work note        Final Clinical Impression(s) / ED Diagnoses Final diagnoses:  None     Rx / DC Orders ED Discharge Orders     None         Lorre Nick, MD 04/29/22 [phone]

## 2022-04-29 NOTE — ED Triage Notes (Signed)
Pt c/o dizziness and right ear pain

## 2022-04-29 NOTE — ED Provider Notes (Signed)
Patient signed out pending MRI.  MRI reviewed and shows no evidence of acute stroke.  Patient reports some persistent dizziness.  Improved with meclizine.  Will discharge with meclizine.  Suspect benign positional vertigo.  Physical Exam  BP 122/62   Pulse 87   Temp 97.8 F (36.6 C) (Oral)   Resp 14   Ht 1.549 m (5\' 1" )   Wt 115.7 kg   LMP 01/10/2012   SpO2 99%   BMI 48.18 kg/m   Procedures  Procedures  ED Course / MDM    Medical Decision Making Amount and/or Complexity of Data Reviewed Radiology: ordered.  Risk Prescription drug management.   Problem List Items Addressed This Visit   None Visit Diagnoses     Dizziness    -  Primary             Merryl Hacker, MD 04/29/22 0006

## 2022-07-07 ENCOUNTER — Emergency Department (HOSPITAL_COMMUNITY)
Admission: EM | Admit: 2022-07-07 | Discharge: 2022-07-08 | Disposition: A | Discharge status: Home / Self Care | Payer: No Typology Code available for payment source | Attending: Emergency Medicine | Admitting: Emergency Medicine

## 2022-07-07 ENCOUNTER — Other Ambulatory Visit: Payer: Self-pay

## 2022-07-07 ENCOUNTER — Emergency Department (HOSPITAL_COMMUNITY): Payer: No Typology Code available for payment source

## 2022-07-07 ENCOUNTER — Encounter (HOSPITAL_COMMUNITY): Payer: Self-pay

## 2022-07-07 DIAGNOSIS — Y9301 Activity, walking, marching and hiking: Secondary | ICD-10-CM | POA: Diagnosis not present

## 2022-07-07 DIAGNOSIS — M25561 Pain in right knee: Secondary | ICD-10-CM | POA: Insufficient documentation

## 2022-07-07 DIAGNOSIS — W108XXA Fall (on) (from) other stairs and steps, initial encounter: Secondary | ICD-10-CM | POA: Diagnosis not present

## 2022-07-07 DIAGNOSIS — M545 Low back pain, unspecified: Secondary | ICD-10-CM | POA: Diagnosis not present

## 2022-07-07 DIAGNOSIS — W19XXXA Unspecified fall, initial encounter: Secondary | ICD-10-CM

## 2022-07-07 DIAGNOSIS — M25532 Pain in left wrist: Secondary | ICD-10-CM | POA: Insufficient documentation

## 2022-07-07 NOTE — ED Provider Notes (Signed)
[facility]-EMERGENCY DEPT [facility] Emergency Department Provider Note MRN:  [id number]  Arrival date & time: 07/08/22     Chief Complaint   Fall and Knee Injury   History of Present Illness   Erica Olson is a 56 y.o. year-old female presents to the ED with chief complaint of fall.  She states that she was at a motel and fell down the stairs when the hand rail gave way.  She states that she landed on her butt and that the right knee twisted backward.  She states that she has been able to walk, but with a limp.    History provided by patient.   Review of Systems  Pertinent positive and negative review of systems noted in HPI.    Physical Exam   Vitals:   07/07/22 2056  BP: (!) 147/59  Pulse: 89  Resp: 16  Temp: 98.1 F (36.7 C)  SpO2: 98%    CONSTITUTIONAL:  well-appearing, NAD NEURO:  Alert and oriented x 3, CN 3-12 grossly intact EYES:  eyes equal and reactive ENT/NECK:  Supple, no stridor  CARDIO:  appears well-perfused  PULM:  No respiratory distress,  GI/GU:  non-distended,  MSK/SPINE:  No gross deformities, no edema, moves all extremities  SKIN:  no rash, atraumatic   *Additional and/or pertinent findings included in MDM below  Diagnostic and Interventional Summary    EKG Interpretation  Date/Time:    Ventricular Rate:    PR Interval:    QRS Duration:   QT Interval:    QTC Calculation:   R Axis:     Text Interpretation:         Labs Reviewed - No data to display  DG Wrist Complete Left  Final Result    DG Lumbar Spine Complete  Final Result    DG Knee Complete 4 Views Right  Final Result      Medications - No data to display   Procedures  /  Critical Care Procedures  ED Course and Medical Decision Making  I have reviewed the triage vital signs, the nursing notes, and pertinent available records from the EMR.  Social Determinants Affecting Complexity of Care: Patient has no clinically significant social determinants affecting this  chief complaint..   ED Course:    Medical Decision Making Patient here with fall.  Fell down some stairs at [employer] after the railing gave away.  She had knee x-ray in triage which is notable for severe tricompartmental arthritis, but fracture.  Internal derangement of knee is not ruled out.  Patient will be put in a knee immobilizer and given crutches.   X-rays of left wrist and lumbar spine are ordered.    Amount and/or Complexity of Data Reviewed Radiology: ordered and independent interpretation performed.    Details: Severe arthritis of left knee, no fracture is seen.     Consultants: No consultations were needed in caring for this patient.   Treatment and Plan: Emergency department workup does not suggest an emergent condition requiring admission or immediate intervention beyond  what has been performed at this time. The patient is safe for discharge and has  been instructed to return immediately for worsening symptoms, change in  symptoms or any other concerns    Final Clinical Impressions(s) / ED Diagnoses     ICD-10-CM   1. Fall, initial encounter  W19.XXXA     2. Acute pain of right knee  M25.561     3. Acute midline low back pain without sciatica  M54.50     4. Left wrist pain  M25.532       ED Discharge Orders     None         Discharge Instructions Discussed with and Provided to Patient:     Discharge Instructions      Take Tylenol or Motrin for pain.  I recommend that you follow-up with the orthopedic doctor listed.  I recommend that you wear the knee brace and use crutches until you are cleared by the orthopedist.        Roxy Horseman, PA-C 07/08/22 Weston Brass, MD 07/08/22 [phone]

## 2022-07-07 NOTE — ED Triage Notes (Signed)
Pt BIB EMS with reports of a fall with a right knee injury. Pt from walking down some stairs and fell. Pt is also complaining of lower back pain.

## 2022-07-08 NOTE — ED Notes (Signed)
This RN applied knee immobilizer to pt's right knee, adjusted properly for proper fit, pt instructed on proper use and wear, pt verbalized understanding.  This RN educated pt on proper crutches use, also adjusted for pt's height. Pt able to demonstrate proper use of crutches with ambulation, pt advised to avoid stairs if possible, pt verbalized understanding.

## 2022-07-08 NOTE — Discharge Instructions (Signed)
Take Tylenol or Motrin for pain.  I recommend that you follow-up with the orthopedic doctor listed.  I recommend that you wear the knee brace and use crutches until you are cleared by the orthopedist.

## 2022-07-22 ENCOUNTER — Ambulatory Visit (HOSPITAL_BASED_OUTPATIENT_CLINIC_OR_DEPARTMENT_OTHER): Payer: No Typology Code available for payment source | Admitting: Orthopaedic Surgery

## 2022-08-29 ENCOUNTER — Other Ambulatory Visit: Payer: Self-pay

## 2022-08-29 ENCOUNTER — Emergency Department (HOSPITAL_COMMUNITY)
Admission: EM | Admit: 2022-08-29 | Discharge: 2022-08-30 | Disposition: A | Discharge status: Home / Self Care | Payer: No Typology Code available for payment source | Attending: Emergency Medicine | Admitting: Emergency Medicine

## 2022-08-29 DIAGNOSIS — M79605 Pain in left leg: Secondary | ICD-10-CM | POA: Insufficient documentation

## 2022-08-29 DIAGNOSIS — G629 Polyneuropathy, unspecified: Secondary | ICD-10-CM

## 2022-08-29 DIAGNOSIS — R42 Dizziness and giddiness: Secondary | ICD-10-CM | POA: Insufficient documentation

## 2022-08-29 DIAGNOSIS — L089 Local infection of the skin and subcutaneous tissue, unspecified: Secondary | ICD-10-CM | POA: Insufficient documentation

## 2022-08-29 DIAGNOSIS — M79604 Pain in right leg: Secondary | ICD-10-CM | POA: Diagnosis not present

## 2022-08-29 DIAGNOSIS — G609 Hereditary and idiopathic neuropathy, unspecified: Secondary | ICD-10-CM | POA: Insufficient documentation

## 2022-08-29 LAB — BASIC METABOLIC PANEL
Anion gap: 7 (ref 5–15)
BUN: 13 mg/dL (ref 6–20)
CO2: 23 mmol/L (ref 22–32)
Calcium: 8.1 mg/dL — ABNORMAL LOW (ref 8.9–10.3)
Chloride: 111 mmol/L (ref 98–111)
Creatinine, Ser: 0.49 mg/dL (ref 0.44–1.00)
GFR, Estimated: >60 mL/min (ref >60)
Glucose, Bld: 173 mg/dL — ABNORMAL HIGH (ref 70–99)
Potassium: 3.5 mmol/L (ref 3.5–5.1)
Sodium: 141 mmol/L (ref 135–145)

## 2022-08-29 LAB — CBC WITH DIFFERENTIAL/PLATELET
Abs Immature Granulocytes: 0 10*3/uL (ref 0.00–0.07)
Basophils Absolute: 0.1 10*3/uL (ref 0.0–0.1)
Basophils Relative: 2 %
Eosinophils Absolute: 0.1 10*3/uL (ref 0.0–0.5)
Eosinophils Relative: 3 %
HCT: 28.5 % — ABNORMAL LOW (ref 36.0–46.0)
Hemoglobin: 9 g/dL — ABNORMAL LOW (ref 12.0–15.0)
Immature Granulocytes: 0 %
Lymphocytes Relative: 40 %
Lymphs Abs: 1.3 10*3/uL (ref 0.7–4.0)
MCH: 28.5 pg (ref 26.0–34.0)
MCHC: 31.6 g/dL (ref 30.0–36.0)
MCV: 90.2 fL (ref 80.0–100.0)
Monocytes Absolute: 0.2 10*3/uL (ref 0.1–1.0)
Monocytes Relative: 6 %
Neutro Abs: 1.6 10*3/uL — ABNORMAL LOW (ref 1.7–7.7)
Neutrophils Relative %: 49 %
Platelets: 96 10*3/uL — ABNORMAL LOW (ref 150–400)
RBC: 3.16 MIL/uL — ABNORMAL LOW (ref 3.87–5.11)
RDW: 14.8 % (ref 11.5–15.5)
Smear Review: DECREASED
WBC: 3.1 10*3/uL — ABNORMAL LOW (ref 4.0–10.5)
nRBC: 0 % (ref 0.0–0.2)

## 2022-08-29 NOTE — ED Triage Notes (Signed)
Pt reports bilateral leg pain and burning. Pt reports she had a spider bite to her back three weeks ago and it has since drained. Pt reports redness and pain to area. Pt reports intermittent dizziness as well.

## 2022-08-30 MED ORDER — SULFAMETHOXAZOLE-TRIMETHOPRIM 800-160 MG PO TABS: 1.0000 | ORAL_TABLET | Freq: Two times a day (BID) | ORAL | 0 refills | Status: AC

## 2022-08-30 MED ORDER — GABAPENTIN 300 MG PO CAPS: 300.0000 mg | ORAL_CAPSULE | Freq: Three times a day (TID) | ORAL | 0 refills | Status: AC

## 2022-08-30 NOTE — Discharge Instructions (Addendum)
Take Bactrim as prescribed for further management of your prior skin infection/wound.  We believe that your lower extremity symptoms are related to neuropathy due to your diabetes.  Take gabapentin as prescribed for this and follow-up with your primary care doctor.  Return for new or concerning symptoms.

## 2022-08-30 NOTE — ED Provider Notes (Signed)
[name] Provider Note   CSN: [id number] Arrival date & time: 08/29/22  2126     History  Chief Complaint  Patient presents with   Skin Problem   Leg Pain   Dizziness    Erica Olson is a 57 y.o. female.  57 year old female with a history of diabetes presents to the emergency department for bilateral lower extremity pain.  She states that she has been experiencing burning pain to her BLE and feet for the past ~2 weeks. No medications taken PTA for symptoms. Denies fevers, leg swelling, color change or rashes, falls/trauma, bowel/bladder incontinence.  Secondarily, patient reports that she had a presumed spider bite to her right upper back 3 weeks ago.  It drained purulence and has been improving, though she has a burning type pain in this area as well.  States that the area sometimes feels warm to touch.  Denies history of prior abscess.  The history is provided by the patient. No language interpreter was used.  Leg Pain      Home Medications Prior to Admission medications   Medication Sig Start Date End Date Taking? Authorizing Provider  gabapentin (NEURONTIN) 300 MG capsule Take 1 capsule (300 mg total) by mouth 3 (three) times daily. 08/30/22  Yes Antonietta Breach, PA-C  sulfamethoxazole-trimethoprim (BACTRIM DS) 800-160 MG tablet Take 1 tablet by mouth 2 (two) times daily for 7 days. 08/30/22 09/06/22 Yes Antonietta Breach, PA-C  diazepam (VALIUM) 2 MG tablet Take 1 tablet (2 mg total) by mouth every 6 (six) hours as needed (Dizziness). 04/29/22   Lacretia Leigh, MD  IBUPROFEN PO Take 1 tablet by mouth every 6 (six) hours as needed (pain).    [provider]  lidocaine (LIDODERM) 5 % Place 1 patch onto the skin daily. Remove & Discard patch within 12 hours or as directed by MD Patient not taking: Reported on 04/28/2022 09/08/21   Maudie Flakes, MD  meclizine (ANTIVERT) 25 MG tablet Take 1 tablet (25 mg total) by mouth 3 (three)  times daily as needed for dizziness. 04/29/22   Horton, Barbette Hair, MD  metFORMIN (GLUCOPHAGE) 500 MG tablet Take 500 mg by mouth 2 (two) times daily with a meal.    [provider]  methocarbamol (ROBAXIN) 500 MG tablet Take 1 tablet (500 mg total) by mouth 2 (two) times daily. Patient not taking: Reported on 04/28/2022 10/05/21   Roemhildt, Lorin T, PA-C      Allergies    Patient has no known allergies.    Review of Systems   Review of Systems Ten systems reviewed and are negative for acute change, except as noted in the HPI.    Physical Exam Updated Vital Signs BP (!) 105/51   Pulse 71   Temp 98 F (36.7 C) (Oral)   Resp 16   Ht 5' 1"$  (1.549 m)   Wt 115.7 kg   LMP 01/10/2012   SpO2 98%   BMI 48.18 kg/m   Physical Exam Vitals and nursing note reviewed.  Constitutional:      General: She is not in acute distress.    Appearance: She is well-developed. She is not diaphoretic.     Comments: Nontoxic-appearing and in no acute distress  HENT:     Head: Normocephalic and atraumatic.  Eyes:     General: No scleral icterus.    Conjunctiva/sclera: Conjunctivae normal.  Cardiovascular:     Rate and Rhythm: Regular rhythm.     Pulses:  Normal pulses.     Comments: DP pulse 2+ in bilateral lower extremities Pulmonary:     Effort: Pulmonary effort is normal. No respiratory distress.     Comments: Respirations even and unlabored Musculoskeletal:        General: Normal range of motion.     Cervical back: Normal range of motion.     Comments: Soft, compressible compartments of bilateral lower extremities.  No pitting edema.  No palpable cords.  Skin:    General: Skin is warm and dry.     Coloration: Skin is not pale.     Findings: No rash.     Comments: Circular area of erythema to the right upper back, overlying the right scapula.  Area is without crepitus, induration, fluctuance, or active drainage.  Neurological:     Mental Status: She is alert and oriented to  person, place, and time.     Coordination: Coordination normal.  Psychiatric:        Behavior: Behavior normal.     ED Results / Procedures / Treatments   Labs (all labs ordered are listed, but only abnormal results are displayed) Labs Reviewed  CBC WITH DIFFERENTIAL/PLATELET - Abnormal; Notable for the following components:      Result Value   WBC 3.1 (*)    RBC 3.16 (*)    Hemoglobin 9.0 (*)    HCT 28.5 (*)    Platelets 96 (*)    Neutro Abs 1.6 (*)    All other components within normal limits  BASIC METABOLIC PANEL - Abnormal; Notable for the following components:   Glucose, Bld 173 (*)    Calcium 8.1 (*)    All other components within normal limits    EKG None  Radiology No results found.  Procedures Procedures    Medications Ordered in ED Medications - No data to display  ED Course/ Medical Decision Making/ A&P                             Medical Decision Making Amount and/or Complexity of Data Reviewed Labs: ordered.  Risk Prescription drug management.   This patient presents to the ED for concern of lower extremity pain, this involves an extensive number of treatment options, and is a complaint that carries with it a high risk of complications and morbidity.  The differential diagnosis includes sciatica vs diabetic neuropathy vs electrolyte derangement vs cauda equina.   Co morbidities that complicate the patient evaluation  DM   Additional history obtained:  External records from outside source obtained and reviewed including lumbar spine Xray 2 months ago; degenerative changes noted w/o acute abnormality   Lab Tests:  I Ordered, and personally interpreted labs.  The pertinent results include:  chronic leukopenia and thrombocytopenia (stable). Anemia of 9.0 (10.4 in October 2023). Glucose 173.   Cardiac Monitoring:  The patient was maintained on a cardiac monitor.  I personally viewed and interpreted the cardiac monitored which showed an  underlying rhythm of: NSR   Medicines ordered and prescription drug management:  I have reviewed the patients home medicines and have made adjustments as needed   Reevaluation:  After the interventions noted above, I reevaluated the patient and found that they have :stayed the same   Social Determinants of Health:  Insured patient   Dispostion:  After consideration of the diagnostic results and the patients response to treatment, I feel that the patent would benefit from trial of gabapentin  pending PCP follow up. Will also place on Bactrim for cellulitis/abscess coverage given residual erythema to back at site of presumed spider bite. Return precautions discussed and provided. Patient discharged in stable condition with no unaddressed concerns.          Final Clinical Impression(s) / ED Diagnoses Final diagnoses:  Neuropathy  Infection of skin and subcutaneous tissue    Rx / DC Orders ED Discharge Orders          Ordered    gabapentin (NEURONTIN) 300 MG capsule  3 times daily        08/30/22 0101    sulfamethoxazole-trimethoprim (BACTRIM DS) 800-160 MG tablet  2 times daily        08/30/22 0101              Antonietta Breach, PA-C 08/30/22 0549    Quintella Reichert, MD 08/30/22 [phone]

## 2022-09-30 ENCOUNTER — Emergency Department (HOSPITAL_COMMUNITY)
Admission: EM | Admit: 2022-09-30 | Discharge: 2022-10-01 | Discharge status: Against Medical Advice | Payer: No Typology Code available for payment source | Attending: Emergency Medicine | Admitting: Emergency Medicine

## 2022-09-30 ENCOUNTER — Encounter (HOSPITAL_COMMUNITY): Payer: Self-pay

## 2022-09-30 ENCOUNTER — Other Ambulatory Visit: Payer: Self-pay

## 2022-09-30 DIAGNOSIS — Z5321 Procedure and treatment not carried out due to patient leaving prior to being seen by health care provider: Secondary | ICD-10-CM | POA: Insufficient documentation

## 2022-09-30 DIAGNOSIS — R609 Edema, unspecified: Secondary | ICD-10-CM | POA: Insufficient documentation

## 2022-09-30 DIAGNOSIS — M7989 Other specified soft tissue disorders: Secondary | ICD-10-CM | POA: Diagnosis present

## 2022-09-30 NOTE — ED Triage Notes (Signed)
BLE swelling x 4 days worse in left leg.   Has been elevating legs after work but returns.

## 2022-09-30 NOTE — ED Provider Triage Note (Signed)
Emergency Medicine Provider Triage Evaluation Note  Erica Olson , a 57 y.o. female  was evaluated in triage.  Pt complains of bilateral lower extremities x 4 days, worse in the left leg.  Notes that she has been elevating her legs after work but the swelling returns.  Denies history of CHF.  Denies chest pain, shortness of breath.  No anticoagulant use.  No recent long travel or surgery.  No history of blood clot in the leg or lungs..  Review of Systems  Positive:  Negative:   Physical Exam  BP 105/89   Pulse 89   Temp 98 F (36.7 C)   Resp 16   Ht '5\' 1"'$  (1.549 m)   Wt 113.4 kg   LMP 01/10/2012   SpO2 100%   BMI 47.24 kg/m  Gen:   Awake, no distress   Resp:  Normal effort  MSK:   Moves extremities without difficulty  Other:  1+ pitting edema noted bilaterally. No overlying skin changes  Medical Decision Making  Medically screening exam initiated at 9:10 PM.  Appropriate orders placed.  Erica Olson was informed that the remainder of the evaluation will be completed by another provider, this initial triage assessment does not replace that evaluation, and the importance of remaining in the ED until their evaluation is complete.  Work-up initiated    Shashana Fullington A, PA-C 09/30/22 2113
